# Patient Record
Sex: Female | Born: 1960 | Race: Asian | Hispanic: No | Marital: Married | Smoking: Never smoker
Health system: Southern US, Community
[De-identification: ages and names within clinical notes are randomized; demographics above are authoritative.]

---

## 2001-10-04 ENCOUNTER — Other Ambulatory Visit: Admission: RE | Admit: 2001-10-04 | Discharge: 2001-10-04 | Payer: Self-pay | Admitting: Obstetrics and Gynecology

## 2002-10-05 ENCOUNTER — Other Ambulatory Visit: Admission: RE | Admit: 2002-10-05 | Discharge: 2002-10-05 | Payer: Self-pay | Admitting: Obstetrics and Gynecology

## 2003-10-08 ENCOUNTER — Other Ambulatory Visit: Admission: RE | Admit: 2003-10-08 | Discharge: 2003-10-08 | Payer: Self-pay | Admitting: Obstetrics and Gynecology

## 2004-10-19 ENCOUNTER — Other Ambulatory Visit: Admission: RE | Admit: 2004-10-19 | Discharge: 2004-10-19 | Payer: Self-pay | Admitting: Obstetrics and Gynecology

## 2004-11-20 ENCOUNTER — Encounter: Admission: RE | Admit: 2004-11-20 | Discharge: 2004-11-20 | Payer: Self-pay | Admitting: Internal Medicine

## 2005-10-21 ENCOUNTER — Other Ambulatory Visit: Admission: RE | Admit: 2005-10-21 | Discharge: 2005-10-21 | Payer: Self-pay | Admitting: Obstetrics and Gynecology

## 2005-11-22 ENCOUNTER — Encounter: Admission: RE | Admit: 2005-11-22 | Discharge: 2005-11-22 | Payer: Self-pay | Admitting: Internal Medicine

## 2008-01-22 ENCOUNTER — Encounter: Admission: RE | Admit: 2008-01-22 | Discharge: 2008-01-22 | Payer: Self-pay | Admitting: Internal Medicine

## 2010-04-26 ENCOUNTER — Encounter: Payer: Self-pay | Admitting: Internal Medicine

## 2013-05-28 ENCOUNTER — Emergency Department (INDEPENDENT_AMBULATORY_CARE_PROVIDER_SITE_OTHER): Payer: BC Managed Care – PPO

## 2013-05-28 ENCOUNTER — Encounter (HOSPITAL_COMMUNITY): Payer: Self-pay | Admitting: Emergency Medicine

## 2013-05-28 ENCOUNTER — Emergency Department (INDEPENDENT_AMBULATORY_CARE_PROVIDER_SITE_OTHER)
Admission: EM | Admit: 2013-05-28 | Discharge: 2013-05-28 | Disposition: A | Discharge status: Home / Self Care | Payer: BC Managed Care – PPO | Source: Home / Self Care

## 2013-05-28 DIAGNOSIS — R059 Cough, unspecified: Secondary | ICD-10-CM

## 2013-05-28 DIAGNOSIS — R05 Cough: Secondary | ICD-10-CM

## 2013-05-28 DIAGNOSIS — R918 Other nonspecific abnormal finding of lung field: Secondary | ICD-10-CM

## 2013-05-28 DIAGNOSIS — J029 Acute pharyngitis, unspecified: Secondary | ICD-10-CM

## 2013-05-28 DIAGNOSIS — R9389 Abnormal findings on diagnostic imaging of other specified body structures: Secondary | ICD-10-CM

## 2013-05-28 MED ORDER — PREDNISONE 10 MG PO TABS: ORAL_TABLET | ORAL | Status: AC

## 2013-05-28 MED ORDER — AZITHROMYCIN 250 MG PO TABS: ORAL_TABLET | ORAL | Status: AC

## 2013-05-28 NOTE — ED Notes (Signed)
Per daughter , who is translator for patient, she has had cough w congestion since 04-05-2013 not responding to OTC medications

## 2013-05-28 NOTE — ED Provider Notes (Signed)
CSN: [id number]     Arrival date & time 05/28/13  1853 History   None    Chief Complaint  Patient presents with  . 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 She notes mucus was thick with a small amount of red blood in it, but that has improved. She denies night sweats, but admits to fever with sweats on/off x a couple of weeks. She had + exposure to sick daughter who also had cough x 4 weeks but improved completely with mucinex. She notes throat mildly sore with swallowing but worse pain with cough. She denies any recent travel. She notes cough has improved with Mucinex and theraflu. She denies HX of Tobacco/ ETOH.   Patient is a 53 y.o. female presenting with cough.  Cough Associated symptoms: fever and sore throat     History reviewed. No pertinent past medical history. History reviewed. No pertinent past surgical history. History reviewed. No pertinent family history. History  Substance Use Topics  . Smoking status: Never Smoker   . Smokeless tobacco: Not on file  . Alcohol Use: No   OB History   Grav Para Term Preterm Abortions TAB SAB Ect Mult Living                 Review of Systems  Constitutional: Positive for fever.  HENT: Positive for sore throat.   Respiratory: Positive for cough.   All other systems reviewed and are negative.      Allergies  Review of patient's allergies indicates no known allergies.  Home Medications  No current outpatient prescriptions on file. BP 134/91  Pulse 94  Temp(Src) 98.5 F (36.9 C) (Oral)  Resp 20  SpO2 100% Physical Exam  Nursing note and vitals reviewed. Constitutional: She is oriented to person, place, and time. She appears well-developed and well-nourished.  HENT:  Head: Normocephalic and atraumatic.  Right Ear: External ear normal.  Left  Ear: External ear normal.  Nose: Nose normal.  Mouth/Throat: Oropharynx is clear and moist. No oropharyngeal exudate.  Post Pharynx with mild erythema   Eyes: Conjunctivae and EOM are normal.  Neck: Normal range of motion.  Bilateral minimal  Cardiovascular: Normal rate, regular rhythm, normal heart sounds and intact distal pulses.   Pulmonary/Chest: Effort normal and breath sounds normal.  Congested Breath sounds, clears some with cough   Abdominal: Soft. Bowel sounds are normal. There is no tenderness.  Musculoskeletal: Normal range of motion.  Lymphadenopathy:    She has cervical adenopathy.  Neurological: She is alert and oriented to person, place, and time.  Skin: Skin is warm and dry.  Psychiatric: She has a normal mood and affect. Judgment normal.    ED Course  Procedures (including critical care time) Labs Review Labs Reviewed - No data to display Imaging Review Dg Chest 2 View  05/28/2013   CLINICAL DATA:  Cough, congestion  EXAM: CHEST  2 VIEW  COMPARISON:  None.  FINDINGS: Cardiac silhouette is mildly enlarged. No effusion, infiltrate, or pneumothorax. No acute osseous abnormality.  IMPRESSION: Cardiomegaly without acute cardiopulmonary findings.   Electronically Signed   By: Genevive BiStewart  Edmunds M.D.   On: 05/28/2013 20:17      MDM  1. Cough ? If related to mild cardiomegaly vs infection- Needs further evaluation at PCP ASAP. ZPAK and Pred 10 mg DP AD, w/c if SX increase or  ER. 2. Sorethroat- push fluids, Warm salt water gargles daily. 1 tsp liquid benadryl + 1 tsp liquid Maalox, MIX/ GARGLE/ SPIT as needed for pain   Berenice Primas, PA-C 05/28/13 2055

## 2013-05-29 NOTE — ED Provider Notes (Signed)
Medical screening examination/treatment/procedure(s) were performed by a resident physician or non-physician practitioner and as the supervising physician I was immediately available for consultation/collaboration.  Sergey Ishler, MD    Philamena Kramar S Jamoni Broadfoot, MD 05/29/13 0726 

## 2014-08-21 ENCOUNTER — Other Ambulatory Visit: Payer: Self-pay | Admitting: Obstetrics and Gynecology

## 2014-08-21 DIAGNOSIS — R928 Other abnormal and inconclusive findings on diagnostic imaging of breast: Secondary | ICD-10-CM

## 2014-08-28 ENCOUNTER — Other Ambulatory Visit: Payer: Self-pay

## 2015-06-09 ENCOUNTER — Ambulatory Visit
Admission: RE | Admit: 2015-06-09 | Discharge: 2015-06-09 | Disposition: A | Discharge status: Home / Self Care | Payer: BLUE CROSS/BLUE SHIELD | Source: Ambulatory Visit | Attending: Obstetrics and Gynecology | Admitting: Obstetrics and Gynecology

## 2015-06-09 DIAGNOSIS — R928 Other abnormal and inconclusive findings on diagnostic imaging of breast: Secondary | ICD-10-CM

## 2015-08-14 ENCOUNTER — Other Ambulatory Visit: Payer: Self-pay | Admitting: Obstetrics and Gynecology

## 2015-08-14 DIAGNOSIS — N63 Unspecified lump in unspecified breast: Secondary | ICD-10-CM

## 2015-08-19 DIAGNOSIS — I10 Essential (primary) hypertension: Secondary | ICD-10-CM | POA: Diagnosis not present

## 2015-08-22 ENCOUNTER — Inpatient Hospital Stay: Admission: RE | Admit: 2015-08-22 | Payer: BLUE CROSS/BLUE SHIELD | Source: Ambulatory Visit

## 2015-09-10 ENCOUNTER — Ambulatory Visit
Admission: RE | Admit: 2015-09-10 | Discharge: 2015-09-10 | Disposition: A | Discharge status: Home / Self Care | Payer: BLUE CROSS/BLUE SHIELD | Source: Ambulatory Visit | Attending: Obstetrics and Gynecology | Admitting: Obstetrics and Gynecology

## 2015-09-10 DIAGNOSIS — N63 Unspecified lump in unspecified breast: Secondary | ICD-10-CM

## 2015-09-10 DIAGNOSIS — N6489 Other specified disorders of breast: Secondary | ICD-10-CM | POA: Diagnosis not present

## 2015-09-10 DIAGNOSIS — Z01419 Encounter for gynecological examination (general) (routine) without abnormal findings: Secondary | ICD-10-CM | POA: Diagnosis not present

## 2015-09-10 DIAGNOSIS — Z6826 Body mass index (BMI) 26.0-26.9, adult: Secondary | ICD-10-CM | POA: Diagnosis not present

## 2015-09-10 DIAGNOSIS — Z1389 Encounter for screening for other disorder: Secondary | ICD-10-CM | POA: Diagnosis not present

## 2015-09-10 DIAGNOSIS — Z793 Long term (current) use of hormonal contraceptives: Secondary | ICD-10-CM | POA: Diagnosis not present

## 2016-02-20 DIAGNOSIS — I10 Essential (primary) hypertension: Secondary | ICD-10-CM | POA: Diagnosis not present

## 2016-08-19 DIAGNOSIS — I10 Essential (primary) hypertension: Secondary | ICD-10-CM | POA: Diagnosis not present

## 2016-10-21 IMAGING — MG 2D DIGITAL DIAGNOSTIC BILATERAL MAMMOGRAM WITH CAD AND ADJUNCT T
8 of 12 series · 8 of 28 positions shown · non-contrast
Comparison: Previous exam(s).

CLINICAL DATA: 54-year-old female for annual bilateral mammograms
and followup left breast asymmetry.

EXAM:
2D DIGITAL DIAGNOSTIC BILATERAL MAMMOGRAM WITH CAD AND ADJUNCT TOMO

[R CC synth-2D]
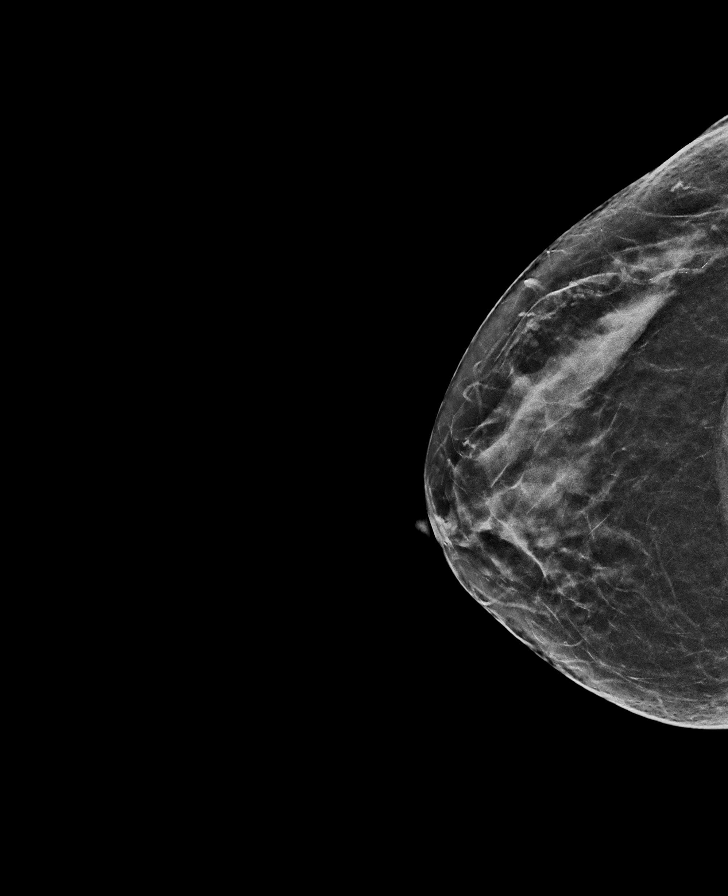

[L CC synth-2D]
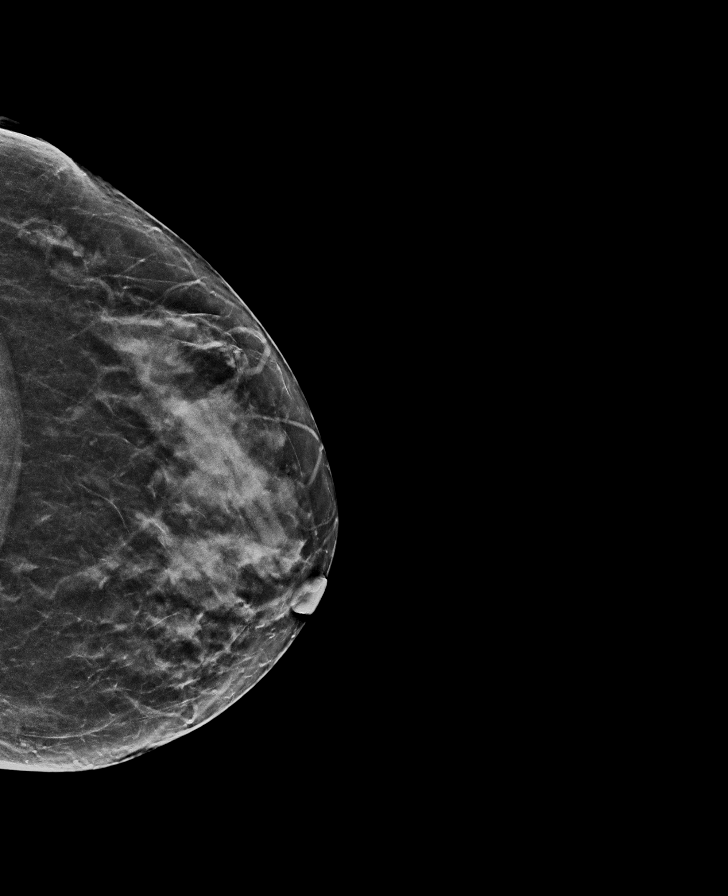

[L MLO synth-2D]
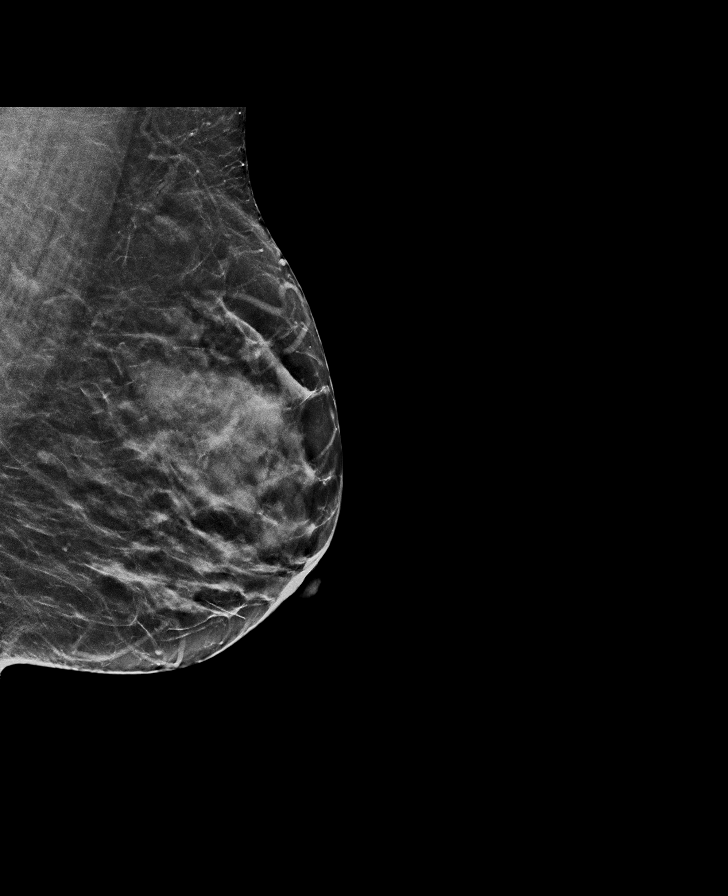

[R CC]
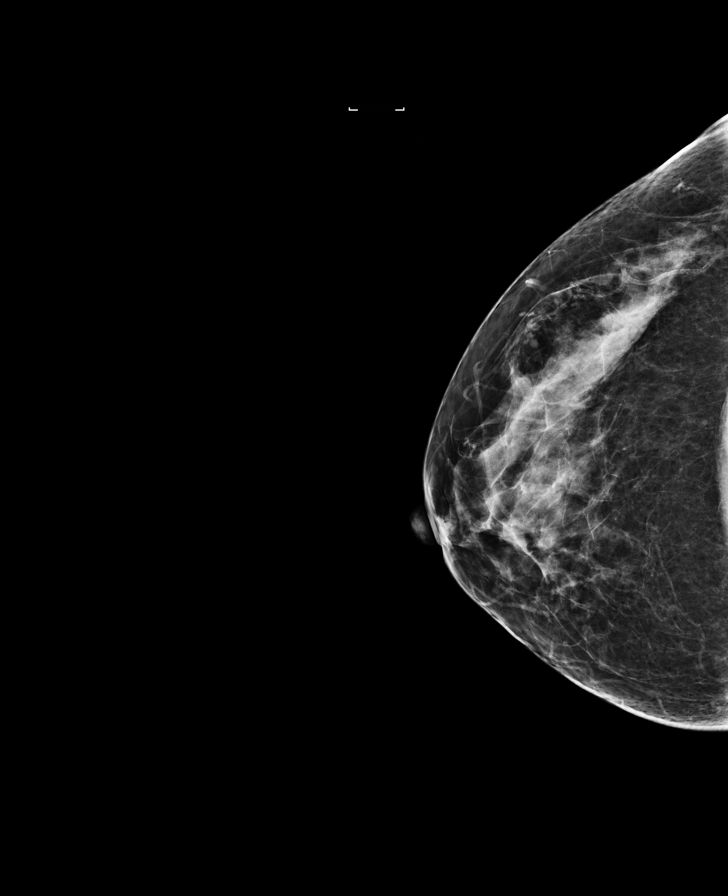

[L CC]
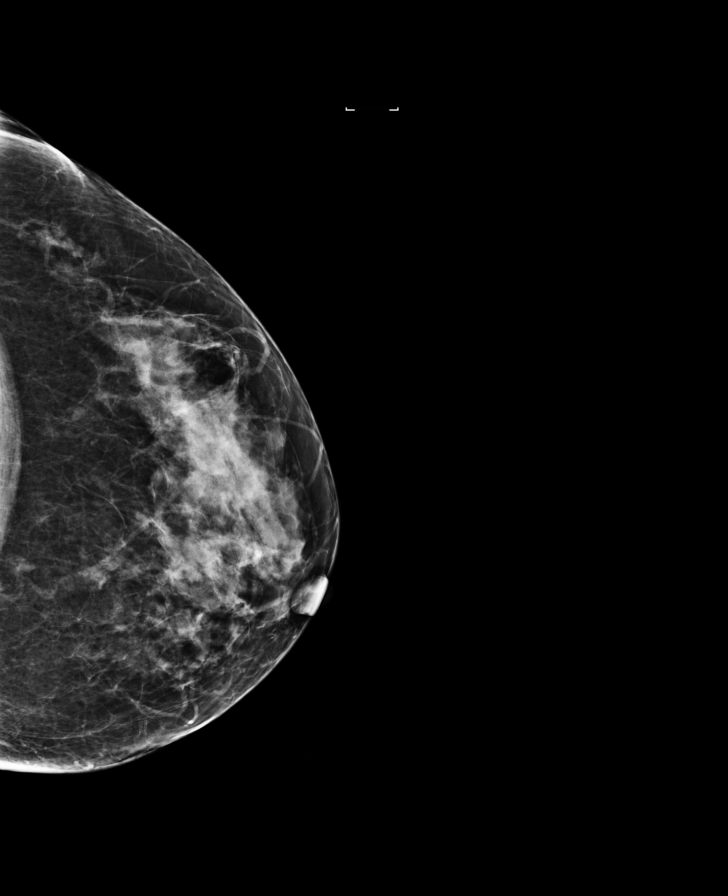

[R MLO]
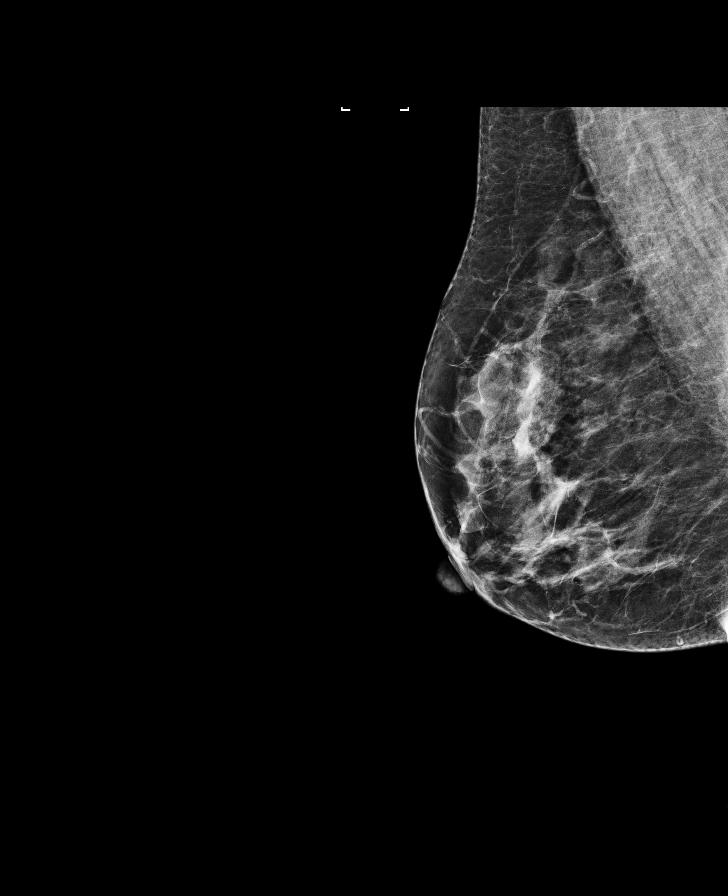

[L MLO]
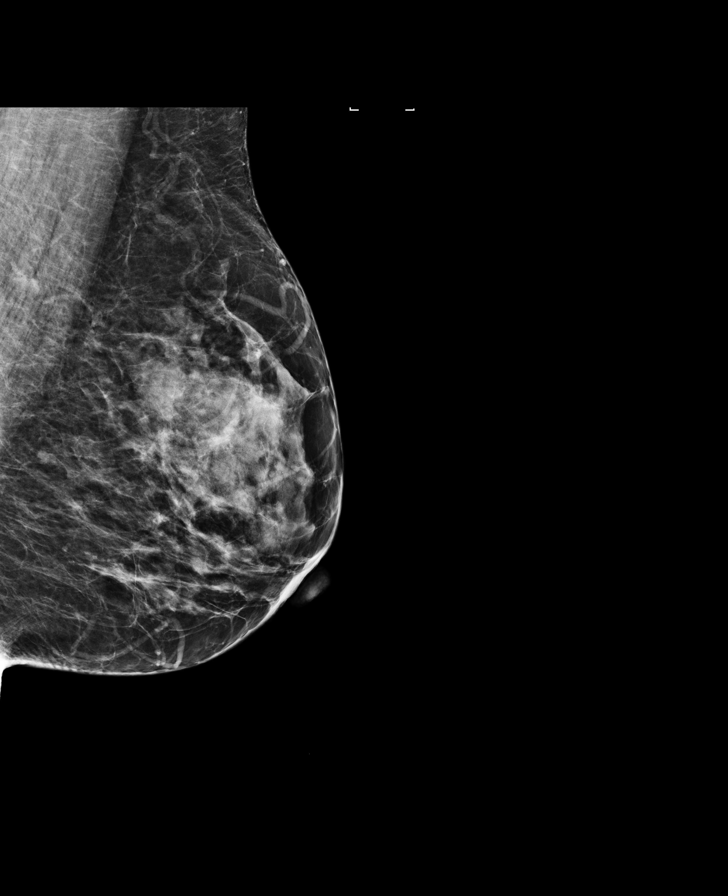

[R MLO synth-2D]
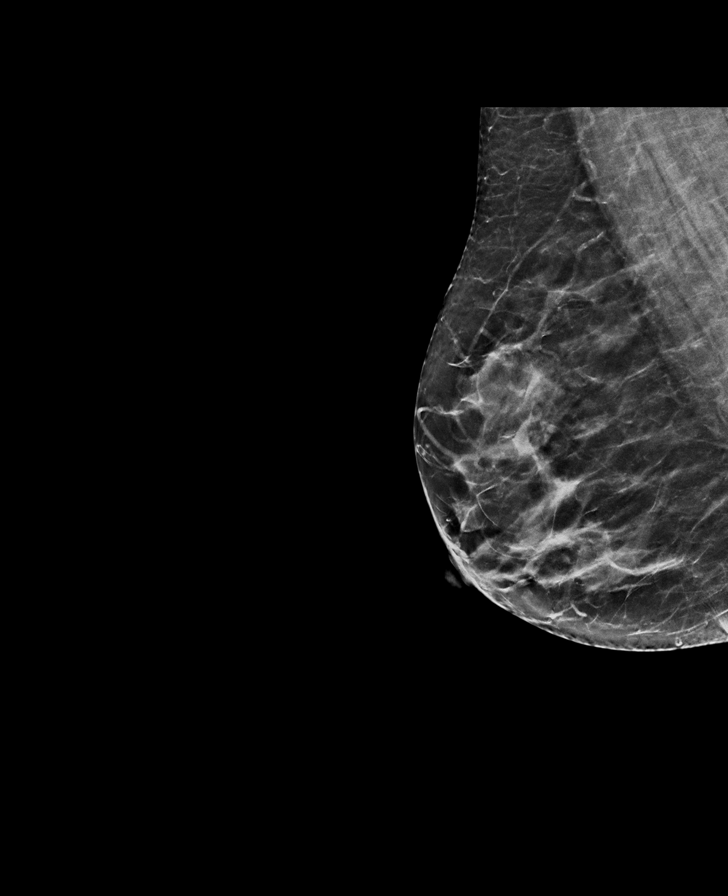

[8 of 28 positions shown; findings below may reference images not displayed]

ACR Breast Density Category b: There are scattered areas of
fibroglandular density.
FINDINGS: 2D and 3D full field views of both breast demonstrate no suspicious
mass, distortion or worrisome calcifications.

The posterior upper left breast asymmetry has decreased in size.

Mammographic images were processed with CAD.
IMPRESSION: No mammographic evidence of breast malignancy.

Decreased size of left breast asymmetry compatible with normal
breast tissue or a benign process.

RECOMMENDATION:
Bilateral screening mammograms in 1 year.

I have discussed the findings and recommendations with the patient.
Results were also provided in writing at the conclusion of the
visit. If applicable, a reminder letter will be sent to the patient
regarding the next appointment.

BI-RADS CATEGORY  2: Benign.

## 2016-11-22 DIAGNOSIS — Z1389 Encounter for screening for other disorder: Secondary | ICD-10-CM | POA: Diagnosis not present

## 2016-11-22 DIAGNOSIS — Z1231 Encounter for screening mammogram for malignant neoplasm of breast: Secondary | ICD-10-CM | POA: Diagnosis not present

## 2016-11-22 DIAGNOSIS — Z6826 Body mass index (BMI) 26.0-26.9, adult: Secondary | ICD-10-CM | POA: Diagnosis not present

## 2016-11-22 DIAGNOSIS — Z130 Encounter for screening for diseases of the blood and blood-forming organs and certain disorders involving the immune mechanism: Secondary | ICD-10-CM | POA: Diagnosis not present

## 2016-11-22 DIAGNOSIS — R319 Hematuria, unspecified: Secondary | ICD-10-CM | POA: Diagnosis not present

## 2016-11-22 DIAGNOSIS — N959 Unspecified menopausal and perimenopausal disorder: Secondary | ICD-10-CM | POA: Diagnosis not present

## 2016-11-22 DIAGNOSIS — Z01419 Encounter for gynecological examination (general) (routine) without abnormal findings: Secondary | ICD-10-CM | POA: Diagnosis not present

## 2016-11-22 DIAGNOSIS — Z1151 Encounter for screening for human papillomavirus (HPV): Secondary | ICD-10-CM | POA: Diagnosis not present

## 2016-11-22 DIAGNOSIS — Z124 Encounter for screening for malignant neoplasm of cervix: Secondary | ICD-10-CM | POA: Diagnosis not present

## 2016-11-23 DIAGNOSIS — Z124 Encounter for screening for malignant neoplasm of cervix: Secondary | ICD-10-CM | POA: Diagnosis not present

## 2016-11-23 DIAGNOSIS — R319 Hematuria, unspecified: Secondary | ICD-10-CM | POA: Diagnosis not present

## 2016-11-24 DIAGNOSIS — R319 Hematuria, unspecified: Secondary | ICD-10-CM | POA: Diagnosis not present

## 2016-12-31 DIAGNOSIS — N959 Unspecified menopausal and perimenopausal disorder: Secondary | ICD-10-CM | POA: Diagnosis not present

## 2017-02-23 DIAGNOSIS — I10 Essential (primary) hypertension: Secondary | ICD-10-CM | POA: Diagnosis not present

## 2017-02-23 DIAGNOSIS — Z0000 Encounter for general adult medical examination without abnormal findings: Secondary | ICD-10-CM | POA: Diagnosis not present

## 2017-02-23 DIAGNOSIS — Z23 Encounter for immunization: Secondary | ICD-10-CM | POA: Diagnosis not present

## 2017-08-10 DIAGNOSIS — I10 Essential (primary) hypertension: Secondary | ICD-10-CM | POA: Diagnosis not present

## 2017-12-20 DIAGNOSIS — Z1389 Encounter for screening for other disorder: Secondary | ICD-10-CM | POA: Diagnosis not present

## 2017-12-20 DIAGNOSIS — Z01419 Encounter for gynecological examination (general) (routine) without abnormal findings: Secondary | ICD-10-CM | POA: Diagnosis not present

## 2017-12-20 DIAGNOSIS — Z1231 Encounter for screening mammogram for malignant neoplasm of breast: Secondary | ICD-10-CM | POA: Diagnosis not present

## 2017-12-20 DIAGNOSIS — Z130 Encounter for screening for diseases of the blood and blood-forming organs and certain disorders involving the immune mechanism: Secondary | ICD-10-CM | POA: Diagnosis not present

## 2017-12-20 DIAGNOSIS — N951 Menopausal and female climacteric states: Secondary | ICD-10-CM | POA: Diagnosis not present

## 2018-02-22 DIAGNOSIS — Z0000 Encounter for general adult medical examination without abnormal findings: Secondary | ICD-10-CM | POA: Diagnosis not present

## 2018-02-22 DIAGNOSIS — I10 Essential (primary) hypertension: Secondary | ICD-10-CM | POA: Diagnosis not present

## 2018-02-22 DIAGNOSIS — Z23 Encounter for immunization: Secondary | ICD-10-CM | POA: Diagnosis not present

## 2018-08-23 DIAGNOSIS — I10 Essential (primary) hypertension: Secondary | ICD-10-CM | POA: Diagnosis not present

## 2018-09-19 DIAGNOSIS — Z1159 Encounter for screening for other viral diseases: Secondary | ICD-10-CM | POA: Diagnosis not present

## 2018-12-25 DIAGNOSIS — Z01812 Encounter for preprocedural laboratory examination: Secondary | ICD-10-CM | POA: Diagnosis not present

## 2018-12-28 DIAGNOSIS — Z86010 Personal history of colonic polyps: Secondary | ICD-10-CM | POA: Diagnosis not present

## 2018-12-28 DIAGNOSIS — K648 Other hemorrhoids: Secondary | ICD-10-CM | POA: Diagnosis not present

## 2019-02-28 DIAGNOSIS — I10 Essential (primary) hypertension: Secondary | ICD-10-CM | POA: Diagnosis not present

## 2019-02-28 DIAGNOSIS — Z23 Encounter for immunization: Secondary | ICD-10-CM | POA: Diagnosis not present

## 2019-02-28 DIAGNOSIS — Z0000 Encounter for general adult medical examination without abnormal findings: Secondary | ICD-10-CM | POA: Diagnosis not present

## 2020-12-16 DIAGNOSIS — Z23 Encounter for immunization: Secondary | ICD-10-CM | POA: Diagnosis not present

## 2021-03-13 DIAGNOSIS — Z13220 Encounter for screening for lipoid disorders: Secondary | ICD-10-CM | POA: Diagnosis not present

## 2021-03-13 DIAGNOSIS — Z0000 Encounter for general adult medical examination without abnormal findings: Secondary | ICD-10-CM | POA: Diagnosis not present

## 2021-03-13 DIAGNOSIS — I10 Essential (primary) hypertension: Secondary | ICD-10-CM | POA: Diagnosis not present

## 2021-12-18 DIAGNOSIS — Z23 Encounter for immunization: Secondary | ICD-10-CM | POA: Diagnosis not present

## 2022-04-16 DIAGNOSIS — Z1159 Encounter for screening for other viral diseases: Secondary | ICD-10-CM | POA: Diagnosis not present

## 2022-04-16 DIAGNOSIS — Z0000 Encounter for general adult medical examination without abnormal findings: Secondary | ICD-10-CM | POA: Diagnosis not present

## 2022-04-16 DIAGNOSIS — Z13220 Encounter for screening for lipoid disorders: Secondary | ICD-10-CM | POA: Diagnosis not present

## 2022-12-29 DIAGNOSIS — Z23 Encounter for immunization: Secondary | ICD-10-CM | POA: Diagnosis not present

## 2023-04-20 DIAGNOSIS — Z13220 Encounter for screening for lipoid disorders: Secondary | ICD-10-CM | POA: Diagnosis not present

## 2023-04-20 DIAGNOSIS — Z0000 Encounter for general adult medical examination without abnormal findings: Secondary | ICD-10-CM | POA: Diagnosis not present

## 2023-04-20 DIAGNOSIS — I10 Essential (primary) hypertension: Secondary | ICD-10-CM | POA: Diagnosis not present
